# Patient Record
Sex: Female | Born: 1955 | Race: Black or African American | Hispanic: No | Marital: Married | Smoking: Never smoker
Health system: Southern US, Community
[De-identification: ages and names within clinical notes are randomized; demographics above are authoritative.]

## PROBLEM LIST (undated history)

## (undated) DIAGNOSIS — I10 Essential (primary) hypertension: Secondary | ICD-10-CM

## (undated) HISTORY — PX: TONSILLECTOMY: SUR1361

---

## 1998-03-09 ENCOUNTER — Other Ambulatory Visit: Admission: RE | Admit: 1998-03-09 | Discharge: 1998-03-09 | Payer: Self-pay | Admitting: *Deleted

## 1999-03-28 ENCOUNTER — Other Ambulatory Visit: Admission: RE | Admit: 1999-03-28 | Discharge: 1999-03-28 | Payer: Self-pay | Admitting: *Deleted

## 1999-05-12 ENCOUNTER — Encounter: Admission: RE | Admit: 1999-05-12 | Discharge: 1999-05-12 | Payer: Self-pay | Admitting: *Deleted

## 1999-05-12 ENCOUNTER — Encounter: Payer: Self-pay | Admitting: *Deleted

## 2000-08-06 ENCOUNTER — Other Ambulatory Visit: Admission: RE | Admit: 2000-08-06 | Discharge: 2000-08-06 | Payer: Self-pay | Admitting: *Deleted

## 2001-04-01 ENCOUNTER — Encounter: Admission: RE | Admit: 2001-04-01 | Discharge: 2001-04-01 | Payer: Self-pay | Admitting: *Deleted

## 2001-04-01 ENCOUNTER — Encounter: Payer: Self-pay | Admitting: *Deleted

## 2002-10-26 ENCOUNTER — Emergency Department (HOSPITAL_COMMUNITY): Admission: EM | Admit: 2002-10-26 | Discharge: 2002-10-26 | Payer: Self-pay | Admitting: Emergency Medicine

## 2003-03-19 ENCOUNTER — Encounter: Admission: RE | Admit: 2003-03-19 | Discharge: 2003-03-19 | Payer: Self-pay | Admitting: Obstetrics and Gynecology

## 2004-10-05 ENCOUNTER — Encounter: Admission: RE | Admit: 2004-10-05 | Discharge: 2004-10-05 | Payer: Self-pay | Admitting: Obstetrics and Gynecology

## 2005-06-08 ENCOUNTER — Other Ambulatory Visit: Admission: RE | Admit: 2005-06-08 | Discharge: 2005-06-08 | Payer: Self-pay | Admitting: Obstetrics and Gynecology

## 2005-09-10 ENCOUNTER — Emergency Department (HOSPITAL_COMMUNITY): Admission: EM | Admit: 2005-09-10 | Discharge: 2005-09-10 | Payer: Self-pay | Admitting: Emergency Medicine

## 2006-01-10 ENCOUNTER — Encounter: Admission: RE | Admit: 2006-01-10 | Discharge: 2006-01-10 | Payer: Self-pay | Admitting: Obstetrics and Gynecology

## 2007-02-18 ENCOUNTER — Encounter: Admission: RE | Admit: 2007-02-18 | Discharge: 2007-02-18 | Payer: Self-pay | Admitting: Obstetrics and Gynecology

## 2007-03-11 ENCOUNTER — Ambulatory Visit (HOSPITAL_COMMUNITY): Admission: RE | Admit: 2007-03-11 | Discharge: 2007-03-11 | Payer: Self-pay | Admitting: *Deleted

## 2008-02-20 ENCOUNTER — Other Ambulatory Visit: Admission: RE | Admit: 2008-02-20 | Discharge: 2008-02-20 | Payer: Self-pay | Admitting: Internal Medicine

## 2008-02-24 ENCOUNTER — Encounter: Admission: RE | Admit: 2008-02-24 | Discharge: 2008-02-24 | Payer: Self-pay | Admitting: Internal Medicine

## 2009-02-23 ENCOUNTER — Other Ambulatory Visit: Admission: RE | Admit: 2009-02-23 | Discharge: 2009-02-23 | Payer: Self-pay | Admitting: Internal Medicine

## 2009-02-24 ENCOUNTER — Encounter: Admission: RE | Admit: 2009-02-24 | Discharge: 2009-02-24 | Payer: Self-pay | Admitting: Internal Medicine

## 2010-02-25 ENCOUNTER — Encounter
Admission: RE | Admit: 2010-02-25 | Discharge: 2010-02-25 | Payer: Self-pay | Source: Home / Self Care | Attending: Internal Medicine | Admitting: Internal Medicine

## 2010-02-28 ENCOUNTER — Encounter
Admission: RE | Admit: 2010-02-28 | Discharge: 2010-02-28 | Payer: Self-pay | Source: Home / Self Care | Attending: Internal Medicine | Admitting: Internal Medicine

## 2010-07-26 NOTE — Op Note (Signed)
NAMETESSIE, ORDAZ NO.:  [id number]   MEDICAL RECORD NO.:  [id number]          PATIENT TYPE:  AMB   LOCATION:  ENDO                         FACILITY:  [facility]   PHYSICIAN:  Georgiana Spinner, M.D.    DATE OF BIRTH:  1955-10-05   DATE OF PROCEDURE:  DATE OF DISCHARGE:                               OPERATIVE REPORT   PROCEDURE:  Colonoscopy.   INDICATIONS:  Colon cancer screening.   ANESTHESIA:  Fentanyl 100 mcg, Versed 8 mg.   PROCEDURE:  With the patient mildly sedated in the left lateral  decubitus position, the Pentax videoscopic colonoscope was inserted in  the rectum, passed under direct vision with pressure applied to reach  the cecum.  The cecum was identified by ileocecal valve and appendiceal  orifice both of which were photographed.  From this point, the  colonoscope was slowly withdrawn taking circumferential views of colonic  mucosa stopping only in the rectum which appeared normal on direct and  retroflexed view.  The endoscope was straightened and withdrawn.  The  patient's vital signs, pulse oximeter remained stable.  The patient  tolerated the procedure well without apparent complications.   FINDINGS:  Negative examination.   PLAN:  Consider repeat examination in 5-10 years.           ______________________________  Georgiana Spinner, M.D.     GMO/MEDQ  D:  03/11/2007  T:  03/11/2007  Job:  [id number]

## 2011-01-27 ENCOUNTER — Other Ambulatory Visit: Payer: Self-pay | Admitting: Internal Medicine

## 2011-01-27 DIAGNOSIS — Z1231 Encounter for screening mammogram for malignant neoplasm of breast: Secondary | ICD-10-CM

## 2011-02-27 ENCOUNTER — Ambulatory Visit
Admission: RE | Admit: 2011-02-27 | Discharge: 2011-02-27 | Disposition: A | Discharge status: Home / Self Care | Payer: Commercial Indemnity | Source: Ambulatory Visit | Attending: Internal Medicine | Admitting: Internal Medicine

## 2011-02-27 DIAGNOSIS — Z1231 Encounter for screening mammogram for malignant neoplasm of breast: Secondary | ICD-10-CM

## 2011-04-05 ENCOUNTER — Other Ambulatory Visit (HOSPITAL_COMMUNITY)
Admission: RE | Admit: 2011-04-05 | Discharge: 2011-04-05 | Disposition: A | Discharge status: Home / Self Care | Payer: Commercial Indemnity | Source: Ambulatory Visit | Attending: Internal Medicine | Admitting: Internal Medicine

## 2011-04-05 DIAGNOSIS — Z01419 Encounter for gynecological examination (general) (routine) without abnormal findings: Secondary | ICD-10-CM | POA: Insufficient documentation

## 2011-06-26 ENCOUNTER — Encounter: Payer: Self-pay | Admitting: *Deleted

## 2012-02-05 ENCOUNTER — Other Ambulatory Visit: Payer: Self-pay | Admitting: Internal Medicine

## 2012-02-05 DIAGNOSIS — Z1231 Encounter for screening mammogram for malignant neoplasm of breast: Secondary | ICD-10-CM

## 2012-03-11 ENCOUNTER — Ambulatory Visit
Admission: RE | Admit: 2012-03-11 | Discharge: 2012-03-11 | Disposition: A | Discharge status: Home / Self Care | Payer: Managed Care, Other (non HMO) | Source: Ambulatory Visit | Attending: Internal Medicine | Admitting: Internal Medicine

## 2012-03-11 DIAGNOSIS — Z1231 Encounter for screening mammogram for malignant neoplasm of breast: Secondary | ICD-10-CM

## 2013-02-20 ENCOUNTER — Other Ambulatory Visit: Payer: Self-pay

## 2013-02-20 DIAGNOSIS — Z1231 Encounter for screening mammogram for malignant neoplasm of breast: Secondary | ICD-10-CM

## 2013-03-31 ENCOUNTER — Ambulatory Visit
Admission: RE | Admit: 2013-03-31 | Discharge: 2013-03-31 | Disposition: A | Discharge status: Home / Self Care | Payer: 59 | Source: Ambulatory Visit

## 2013-03-31 DIAGNOSIS — Z1231 Encounter for screening mammogram for malignant neoplasm of breast: Secondary | ICD-10-CM

## 2014-03-09 ENCOUNTER — Other Ambulatory Visit: Payer: Self-pay

## 2014-03-09 DIAGNOSIS — Z1231 Encounter for screening mammogram for malignant neoplasm of breast: Secondary | ICD-10-CM

## 2014-04-06 ENCOUNTER — Ambulatory Visit
Admission: RE | Admit: 2014-04-06 | Discharge: 2014-04-06 | Disposition: A | Discharge status: Home / Self Care | Payer: 59 | Source: Ambulatory Visit

## 2014-04-06 ENCOUNTER — Encounter (INDEPENDENT_AMBULATORY_CARE_PROVIDER_SITE_OTHER): Payer: Self-pay

## 2014-04-06 DIAGNOSIS — Z1231 Encounter for screening mammogram for malignant neoplasm of breast: Secondary | ICD-10-CM

## 2015-04-12 ENCOUNTER — Other Ambulatory Visit: Payer: Self-pay

## 2015-04-12 DIAGNOSIS — Z1231 Encounter for screening mammogram for malignant neoplasm of breast: Secondary | ICD-10-CM

## 2015-04-23 ENCOUNTER — Ambulatory Visit
Admission: RE | Admit: 2015-04-23 | Discharge: 2015-04-23 | Disposition: A | Discharge status: Home / Self Care | Payer: 59 | Source: Ambulatory Visit

## 2015-04-23 DIAGNOSIS — Z1231 Encounter for screening mammogram for malignant neoplasm of breast: Secondary | ICD-10-CM

## 2015-04-29 ENCOUNTER — Encounter (HOSPITAL_COMMUNITY): Payer: Self-pay | Admitting: Emergency Medicine

## 2015-04-29 ENCOUNTER — Emergency Department (HOSPITAL_COMMUNITY)
Admission: EM | Admit: 2015-04-29 | Discharge: 2015-04-29 | Disposition: A | Discharge status: Home / Self Care | Payer: 59 | Attending: Emergency Medicine | Admitting: Emergency Medicine

## 2015-04-29 DIAGNOSIS — R55 Syncope and collapse: Secondary | ICD-10-CM

## 2015-04-29 DIAGNOSIS — R42 Dizziness and giddiness: Secondary | ICD-10-CM | POA: Diagnosis present

## 2015-04-29 DIAGNOSIS — I10 Essential (primary) hypertension: Secondary | ICD-10-CM | POA: Diagnosis not present

## 2015-04-29 HISTORY — DX: Essential (primary) hypertension: I10

## 2015-04-29 LAB — BASIC METABOLIC PANEL
ANION GAP: 10 (ref 5–15)
BUN: 13 mg/dL (ref 6–20)
CALCIUM: 9 mg/dL (ref 8.9–10.3)
CO2: 23 mmol/L (ref 22–32)
Chloride: 106 mmol/L (ref 101–111)
Creatinine, Ser: 0.81 mg/dL (ref 0.44–1.00)
Glucose, Bld: 144 mg/dL — ABNORMAL HIGH (ref 65–99)
Potassium: 3.5 mmol/L (ref 3.5–5.1)
SODIUM: 139 mmol/L (ref 135–145)

## 2015-04-29 LAB — CBG MONITORING, ED: GLUCOSE-CAPILLARY: 129 mg/dL — AB (ref 65–99)

## 2015-04-29 LAB — URINE MICROSCOPIC-ADD ON: WBC, UA: NONE SEEN WBC/hpf (ref 0–5)

## 2015-04-29 LAB — TROPONIN I: Troponin I: <0.03 ng/mL

## 2015-04-29 LAB — URINALYSIS, ROUTINE W REFLEX MICROSCOPIC
BILIRUBIN URINE: NEGATIVE
Glucose, UA: NEGATIVE mg/dL
Ketones, ur: 40 mg/dL — AB
Leukocytes, UA: NEGATIVE
NITRITE: NEGATIVE
PROTEIN: 30 mg/dL — AB
SPECIFIC GRAVITY, URINE: 1.027 (ref 1.005–1.030)
pH: 5.5 (ref 5.0–8.0)

## 2015-04-29 LAB — CBC
HCT: 37.1 % (ref 36.0–46.0)
HEMOGLOBIN: 11.4 g/dL — AB (ref 12.0–15.0)
MCH: 24.2 pg — ABNORMAL LOW (ref 26.0–34.0)
MCHC: 30.7 g/dL (ref 30.0–36.0)
MCV: 78.6 fL (ref 78.0–100.0)
PLATELETS: 152 10*3/uL (ref 150–400)
RBC: 4.72 MIL/uL (ref 3.87–5.11)
RDW: 13.5 % (ref 11.5–15.5)
WBC: 5.7 10*3/uL (ref 4.0–10.5)

## 2015-04-29 MED ORDER — SODIUM CHLORIDE 0.9 % IV BOLUS (SEPSIS)
1000.0000 mL | Freq: Once | INTRAVENOUS | Status: AC
  Administered 2015-04-29: 1000 mL via INTRAVENOUS

## 2015-04-29 MED ORDER — IBUPROFEN 200 MG PO TABS
600.0000 mg | ORAL_TABLET | Freq: Once | ORAL | Status: AC
  Administered 2015-04-29: 600 mg via ORAL
  Filled 2015-04-29: qty 1

## 2015-04-29 NOTE — Discharge Instructions (Signed)
Syncope Syncope means a person passes out (faints). The person usually wakes up in less than 5 minutes. It is important to seek medical care for syncope. HOME CARE  Have someone stay with you until you feel normal.  Do not drive, use machines, or play sports until your doctor says it is okay.  Keep all doctor visits as told.  Lie down when you feel like you might pass out. Take deep breaths. Wait until you feel normal before standing up.  Drink enough fluids to keep your pee (urine) clear or pale yellow.  If you take blood pressure or heart medicine, get up slowly. Take several minutes to sit and then stand. GET HELP RIGHT AWAY IF:   You have a severe headache.  You have pain in the chest, belly (abdomen), or back.  You are bleeding from the mouth or butt (rectum).  You have black or tarry poop (stool).  You have an irregular or very fast heartbeat.  You have pain with breathing.  You keep passing out, or you have shaking (seizures) when you pass out.  You pass out when sitting or lying down.  You feel confused.  You have trouble walking.  You have severe weakness.  You have vision problems. If you fainted, call for help (911 in [place] Do not drive yourself to the hospital.   This information is not intended to replace advice given to you by your health care provider. Make sure you discuss any questions you have with your health care provider.   Document Released: 08/16/2007 Document Revised: 07/14/2014 Document Reviewed: 04/28/2011 Elsevier Interactive Patient Education 2016 [employer]  Dizziness Dizziness is a common problem. It is a feeling of unsteadiness or light-headedness. You may feel like you are about to faint. Dizziness can lead to injury if you stumble or fall. Anyone can become dizzy, but dizziness is more common in older adults. This condition can be caused by a number of things, including medicines, dehydration, or illness. HOME CARE  INSTRUCTIONS Taking these steps may help with your condition: Eating and Drinking  Drink enough fluid to keep your urine clear or pale yellow. This helps to keep you from becoming dehydrated. Try to drink more clear fluids, such as water.  Do not drink alcohol.  Limit your caffeine intake if directed by your health care provider.  Limit your salt intake if directed by your health care provider. Activity  Avoid making quick movements.  Rise slowly from chairs and steady yourself until you feel okay.  In the morning, first sit up on the side of the bed. When you feel okay, stand slowly while you hold onto something until you know that your balance is fine.  Move your legs often if you need to stand in one place for a long time. Tighten and relax your muscles in your legs while you are standing.  Do not drive or operate heavy machinery if you feel dizzy.  Avoid bending down if you feel dizzy. Place items in your home so that they are easy for you to reach without leaning over. Lifestyle  Do not use any tobacco products, including cigarettes, chewing tobacco, or electronic cigarettes. If you need help quitting, ask your health care provider.  Try to reduce your stress level, such as with yoga or meditation. Talk with your health care provider if you need help. General Instructions  Watch your dizziness for any changes.  Take medicines only as directed by your health care provider. Talk with  your health care provider if you think that your dizziness is caused by a medicine that you are taking.  Tell a friend or a family member that you are feeling dizzy. If he or she notices any changes in your behavior, have this person call your health care provider.  Keep all follow-up visits as directed by your health care provider. This is important. SEEK MEDICAL CARE IF:  Your dizziness does not go away.  Your dizziness or light-headedness gets worse.  You feel nauseous.  You have  reduced hearing.  You have new symptoms.  You are unsteady on your feet or you feel like the room is spinning. SEEK IMMEDIATE MEDICAL CARE IF:  You vomit or have diarrhea and are unable to eat or drink anything.  You have problems talking, walking, swallowing, or using your arms, hands, or legs.  You feel generally weak.  You are not thinking clearly or you have trouble forming sentences. It may take a friend or family member to notice this.  You have chest pain, abdominal pain, shortness of breath, or sweating.  Your vision changes.  You notice any bleeding.  You have a headache.  You have neck pain or a stiff neck.  You have a fever.   This information is not intended to replace advice given to you by your health care provider. Make sure you discuss any questions you have with your health care provider.   Follow up with your primary care provider for reevaluation. Encourage adequate hydration, drink plenty of fluids. May take ibuprofen for your body aches. Return to the emergency department if you experience severe worsening of her symptoms, additional loss of consciousness, severe headache, blurry vision, fever, chills, vomiting, chest pain or shortness of breath.

## 2015-04-29 NOTE — ED Notes (Signed)
Pt states that she did not feel dizzy at any point during orthostatic Vital Sign

## 2015-04-29 NOTE — ED Provider Notes (Signed)
CSN: [id number]     Arrival date & time 04/29/15  0510 History   First MD Initiated Contact with Patient 04/29/15 [phone]     Chief Complaint  Patient presents with  . Dizziness     (Consider location/radiation/quality/duration/timing/severity/associated sxs/prior Treatment) HPI   Betty Walker is a 60 y.o F with a pmhx of HTN who presents to the ED today c/o lightheadedness and syncope. Pt states that yesterday she felt gradual onset lightheadedness upon standing. Pt had associated "spots in her vision" and myalgias. Pt states that she woke up around 4 am this morning for work and again felt lightheaded that progressively worsened. Pt decided to go lay back down, but per her husband she passed out on her way back to the bedroom. Per husband pts eyes rolled back in her head and she fainted. Husband caught her and layed her on the bed. He states that the pt was out for only a few seconds. Pt does not recall this event. Since this even pt states she has not had the feeling of lightheadedness but is still experiencing myalgias. Pt states that she received a TdaP vaccine on 2/14. Denies N/V, recent illness, CP, SOB, paresthesias, weakness. Pt has had decreased appetite over the last 2 days. No hx of AS or CHF.   Past Medical History  Diagnosis Date  . Hypertension    Past Surgical History  Procedure Laterality Date  . Tonsillectomy     No family history on file. Social History  Substance Use Topics  . Smoking status: Never Smoker   . Smokeless tobacco: None  . Alcohol Use: No   OB History    No data available     Review of Systems  All other systems reviewed and are negative.     Allergies  Review of patient's allergies indicates no known allergies.  Home Medications   Prior to Admission medications   Not on File   BP 123/74 mmHg  Pulse 73  Temp(Src) 97.9 F (36.6 C) (Oral)  Resp 17  Ht  (1.575 m)  Wt 66.679 kg  BMI 26.88 kg/m2  SpO2 100% Physical Exam   Constitutional: She is oriented to person, place, and time. She appears well-developed and well-nourished. No distress.  HENT:  Head: Normocephalic and atraumatic.  Mouth/Throat: No oropharyngeal exudate.  Eyes: Conjunctivae and EOM are normal. Pupils are equal, round, and reactive to light. Right eye exhibits no discharge. Left eye exhibits no discharge. No scleral icterus.  Neck: Normal range of motion. Neck supple. No JVD present.  Cardiovascular: Normal rate, regular rhythm, normal heart sounds and intact distal pulses.  Exam reveals no gallop and no friction rub.   No murmur heard. Pulmonary/Chest: Effort normal and breath sounds normal. No respiratory distress. She has no wheezes. She has no rales. She exhibits no tenderness.  Abdominal: Soft. Bowel sounds are normal. She exhibits no distension. There is no tenderness. There is no guarding.  Musculoskeletal: Normal range of motion. She exhibits no edema.  Lymphadenopathy:    She has no cervical adenopathy.  Neurological: She is alert and oriented to person, place, and time.  Strength 5/5 throughout. No sensory deficits.  No gait abnormality.  Skin: Skin is warm and dry. No rash noted. She is not diaphoretic. No erythema. No pallor.  Psychiatric: She has a normal mood and affect. Her behavior is normal.  Nursing note and vitals reviewed.   ED Course  Procedures (including critical care time) Labs Review Labs Reviewed  BASIC METABOLIC PANEL - Abnormal; Notable for the following:    Glucose, Bld 144 (*)    All other components within normal limits  CBC - Abnormal; Notable for the following:    Hemoglobin 11.4 (*)    MCH 24.2 (*)    All other components within normal limits  URINALYSIS, ROUTINE W REFLEX MICROSCOPIC (NOT AT [facility]) - Abnormal; Notable for the following:    Color, Urine AMBER (*)    Hgb urine dipstick SMALL (*)    Ketones, ur 40 (*)    Protein, ur 30 (*)    All other components within normal limits  URINE  MICROSCOPIC-ADD ON - Abnormal; Notable for the following:    Squamous Epithelial / LPF 0-5 (*)    Bacteria, UA RARE (*)    All other components within normal limits  CBG MONITORING, ED - Abnormal; Notable for the following:    Glucose-Capillary 129 (*)    All other components within normal limits  TROPONIN I    Imaging Review No results found. I have personally reviewed and evaluated these images and lab results as part of my medical decision-making.   EKG Interpretation   Date/Time:  Thursday April 29 2015 05:29:21 EST Ventricular Rate:  76 PR Interval:  132 QRS Duration: 90 QT Interval:  386 QTC Calculation: 434 R Axis:   55 Text Interpretation:  Normal sinus rhythm Cannot rule out Anterior infarct  , age undetermined Abnormal ECG Nonspecific T wave abnormality No old  tracing to compare Confirmed by [facility]  MD, DAVID ([id number]) on 04/29/2015  6:30:42 AM      MDM   Final diagnoses:  Lightheadedness  Syncope, unspecified syncope type    Otherwise healthy 60 y.o F presents to the ED c/o lightheadedness onset yesterday and episode of syncope that occurred around 4am. Pt felt lightheaded upon standing and subsequently passed out for approximately 3 seconds per husband who witnessed event. Pt is asymptomatic in ED, appears well. Only c/o mild myalgias which have been present since TdaP vaccine received on 04/26/14. EKG unremarkable. Troponin wnl. All other lab work unremarkable. No murmur auscultated, doubt AS. Suspect orthostatic hypotension vs vasovagal as etiology of syncope. Pt is low risk on [place] syncope rule. No hx of CHF. Pt given 1L fluids. Orthostatic VS wnl in ED. Ibuprofen given for myalgias.   Patient reports significant symptomatic improvement after fluids and ibuprofen. Feel the patient is safe for discharge with PCP follow-up. Doubt cardiogenic etiology of syncope. Return precautions outlined in patient discharge instructions.   Patient was discussed with  Dr. Preston Fleeting who agrees with the treatment plan.      Lester Kinsman Lennox, PA-C 04/29/15 [id number]  Dione Booze, MD 04/30/15 [phone]

## 2015-04-29 NOTE — ED Notes (Signed)
Patient here with complaint of dizziness. States onset yesterday 04/28/2015 @ 1100. Reports that she felt weak and dizzy all day yesterday which prompted her to go to bed early. Husband reports that patient woke early this morning around 0230, and subsequently passed out around 0345. Denies paresthesia. No neuro deficits identified in triage. Additionally reports that she received a TDAP vaccine on Tues 04/27/2015. Also endorses body aches and soreness in the arm which received the injection.

## 2016-03-22 ENCOUNTER — Other Ambulatory Visit: Payer: Self-pay | Admitting: Internal Medicine

## 2016-03-22 DIAGNOSIS — Z1231 Encounter for screening mammogram for malignant neoplasm of breast: Secondary | ICD-10-CM

## 2016-06-09 ENCOUNTER — Ambulatory Visit
Admission: RE | Admit: 2016-06-09 | Discharge: 2016-06-09 | Disposition: A | Discharge status: Home / Self Care | Payer: 59 | Source: Ambulatory Visit | Attending: Internal Medicine | Admitting: Internal Medicine

## 2016-06-09 DIAGNOSIS — Z1231 Encounter for screening mammogram for malignant neoplasm of breast: Secondary | ICD-10-CM

## 2017-07-24 ENCOUNTER — Other Ambulatory Visit: Payer: Self-pay | Admitting: Internal Medicine

## 2017-07-24 DIAGNOSIS — Z1231 Encounter for screening mammogram for malignant neoplasm of breast: Secondary | ICD-10-CM

## 2017-08-15 ENCOUNTER — Ambulatory Visit
Admission: RE | Admit: 2017-08-15 | Discharge: 2017-08-15 | Disposition: A | Discharge status: Home / Self Care | Payer: PRIVATE HEALTH INSURANCE | Source: Ambulatory Visit | Attending: Internal Medicine | Admitting: Internal Medicine

## 2017-08-15 DIAGNOSIS — Z1231 Encounter for screening mammogram for malignant neoplasm of breast: Secondary | ICD-10-CM

## 2018-11-27 ENCOUNTER — Other Ambulatory Visit: Payer: Self-pay | Admitting: Internal Medicine

## 2018-11-27 DIAGNOSIS — Z1231 Encounter for screening mammogram for malignant neoplasm of breast: Secondary | ICD-10-CM

## 2019-01-13 ENCOUNTER — Ambulatory Visit
Admission: RE | Admit: 2019-01-13 | Discharge: 2019-01-13 | Disposition: A | Discharge status: Home / Self Care | Payer: No Typology Code available for payment source | Source: Ambulatory Visit | Attending: Internal Medicine | Admitting: Internal Medicine

## 2019-01-13 ENCOUNTER — Other Ambulatory Visit: Payer: Self-pay

## 2019-01-13 DIAGNOSIS — Z1231 Encounter for screening mammogram for malignant neoplasm of breast: Secondary | ICD-10-CM

## 2019-12-08 ENCOUNTER — Other Ambulatory Visit: Payer: Self-pay | Admitting: Obstetrics and Gynecology

## 2019-12-08 DIAGNOSIS — Z1231 Encounter for screening mammogram for malignant neoplasm of breast: Secondary | ICD-10-CM

## 2020-01-15 ENCOUNTER — Ambulatory Visit: Payer: No Typology Code available for payment source

## 2020-01-20 ENCOUNTER — Other Ambulatory Visit: Payer: Self-pay

## 2020-01-20 ENCOUNTER — Ambulatory Visit
Admission: RE | Admit: 2020-01-20 | Discharge: 2020-01-20 | Disposition: A | Discharge status: Home / Self Care | Payer: No Typology Code available for payment source | Source: Ambulatory Visit | Attending: Obstetrics and Gynecology | Admitting: Obstetrics and Gynecology

## 2020-01-20 DIAGNOSIS — Z1231 Encounter for screening mammogram for malignant neoplasm of breast: Secondary | ICD-10-CM

## 2020-12-14 ENCOUNTER — Other Ambulatory Visit: Payer: Self-pay | Admitting: Obstetrics and Gynecology

## 2020-12-14 DIAGNOSIS — Z1231 Encounter for screening mammogram for malignant neoplasm of breast: Secondary | ICD-10-CM

## 2021-01-21 ENCOUNTER — Ambulatory Visit
Admission: RE | Admit: 2021-01-21 | Discharge: 2021-01-21 | Disposition: A | Discharge status: Home / Self Care | Payer: Medicare HMO | Source: Ambulatory Visit | Attending: Obstetrics and Gynecology | Admitting: Obstetrics and Gynecology

## 2021-01-21 DIAGNOSIS — Z1231 Encounter for screening mammogram for malignant neoplasm of breast: Secondary | ICD-10-CM

## 2021-03-09 ENCOUNTER — Ambulatory Visit: Payer: Medicare HMO | Admitting: Radiation Oncology

## 2021-04-21 DIAGNOSIS — Z6825 Body mass index (BMI) 25.0-25.9, adult: Secondary | ICD-10-CM | POA: Diagnosis not present

## 2021-04-21 DIAGNOSIS — Z01419 Encounter for gynecological examination (general) (routine) without abnormal findings: Secondary | ICD-10-CM | POA: Diagnosis not present

## 2021-06-08 DIAGNOSIS — Z0000 Encounter for general adult medical examination without abnormal findings: Secondary | ICD-10-CM | POA: Diagnosis not present

## 2021-06-08 DIAGNOSIS — R7309 Other abnormal glucose: Secondary | ICD-10-CM | POA: Diagnosis not present

## 2021-06-08 DIAGNOSIS — I10 Essential (primary) hypertension: Secondary | ICD-10-CM | POA: Diagnosis not present

## 2021-06-08 DIAGNOSIS — R7989 Other specified abnormal findings of blood chemistry: Secondary | ICD-10-CM | POA: Diagnosis not present

## 2021-06-08 DIAGNOSIS — R3129 Other microscopic hematuria: Secondary | ICD-10-CM | POA: Diagnosis not present

## 2021-06-15 DIAGNOSIS — Z0000 Encounter for general adult medical examination without abnormal findings: Secondary | ICD-10-CM | POA: Diagnosis not present

## 2021-06-15 DIAGNOSIS — R7309 Other abnormal glucose: Secondary | ICD-10-CM | POA: Diagnosis not present

## 2021-06-15 DIAGNOSIS — Z1211 Encounter for screening for malignant neoplasm of colon: Secondary | ICD-10-CM | POA: Diagnosis not present

## 2021-06-15 DIAGNOSIS — M810 Age-related osteoporosis without current pathological fracture: Secondary | ICD-10-CM | POA: Diagnosis not present

## 2021-06-15 DIAGNOSIS — I10 Essential (primary) hypertension: Secondary | ICD-10-CM | POA: Diagnosis not present

## 2021-07-04 DIAGNOSIS — M810 Age-related osteoporosis without current pathological fracture: Secondary | ICD-10-CM | POA: Diagnosis not present

## 2021-08-01 DIAGNOSIS — M810 Age-related osteoporosis without current pathological fracture: Secondary | ICD-10-CM | POA: Diagnosis not present

## 2021-08-01 DIAGNOSIS — I10 Essential (primary) hypertension: Secondary | ICD-10-CM | POA: Diagnosis not present

## 2021-08-01 DIAGNOSIS — Z1211 Encounter for screening for malignant neoplasm of colon: Secondary | ICD-10-CM | POA: Diagnosis not present

## 2021-09-08 DIAGNOSIS — Z1211 Encounter for screening for malignant neoplasm of colon: Secondary | ICD-10-CM | POA: Diagnosis not present

## 2021-10-24 DIAGNOSIS — H6120 Impacted cerumen, unspecified ear: Secondary | ICD-10-CM | POA: Diagnosis not present

## 2021-12-23 ENCOUNTER — Other Ambulatory Visit: Payer: Self-pay | Admitting: Obstetrics and Gynecology

## 2021-12-23 DIAGNOSIS — Z1231 Encounter for screening mammogram for malignant neoplasm of breast: Secondary | ICD-10-CM

## 2022-01-12 DIAGNOSIS — H524 Presbyopia: Secondary | ICD-10-CM | POA: Diagnosis not present

## 2022-01-12 DIAGNOSIS — H25813 Combined forms of age-related cataract, bilateral: Secondary | ICD-10-CM | POA: Diagnosis not present

## 2022-01-12 DIAGNOSIS — H5213 Myopia, bilateral: Secondary | ICD-10-CM | POA: Diagnosis not present

## 2022-02-08 ENCOUNTER — Ambulatory Visit
Admission: RE | Admit: 2022-02-08 | Discharge: 2022-02-08 | Disposition: A | Discharge status: Home / Self Care | Payer: Medicare HMO | Source: Ambulatory Visit | Attending: Obstetrics and Gynecology | Admitting: Obstetrics and Gynecology

## 2022-02-08 DIAGNOSIS — Z1231 Encounter for screening mammogram for malignant neoplasm of breast: Secondary | ICD-10-CM

## 2022-06-13 DIAGNOSIS — R7309 Other abnormal glucose: Secondary | ICD-10-CM | POA: Diagnosis not present

## 2022-06-13 DIAGNOSIS — I10 Essential (primary) hypertension: Secondary | ICD-10-CM | POA: Diagnosis not present

## 2022-06-13 DIAGNOSIS — E7800 Pure hypercholesterolemia, unspecified: Secondary | ICD-10-CM | POA: Diagnosis not present

## 2022-06-13 DIAGNOSIS — M810 Age-related osteoporosis without current pathological fracture: Secondary | ICD-10-CM | POA: Diagnosis not present

## 2022-06-13 DIAGNOSIS — Z13820 Encounter for screening for osteoporosis: Secondary | ICD-10-CM | POA: Diagnosis not present

## 2022-06-13 DIAGNOSIS — R7989 Other specified abnormal findings of blood chemistry: Secondary | ICD-10-CM | POA: Diagnosis not present

## 2022-06-20 DIAGNOSIS — Z0000 Encounter for general adult medical examination without abnormal findings: Secondary | ICD-10-CM | POA: Diagnosis not present

## 2022-06-20 DIAGNOSIS — I10 Essential (primary) hypertension: Secondary | ICD-10-CM | POA: Diagnosis not present

## 2022-06-20 DIAGNOSIS — H6120 Impacted cerumen, unspecified ear: Secondary | ICD-10-CM | POA: Diagnosis not present

## 2022-06-20 DIAGNOSIS — M810 Age-related osteoporosis without current pathological fracture: Secondary | ICD-10-CM | POA: Diagnosis not present

## 2022-07-04 DIAGNOSIS — Z6825 Body mass index (BMI) 25.0-25.9, adult: Secondary | ICD-10-CM | POA: Diagnosis not present

## 2022-07-04 DIAGNOSIS — Z124 Encounter for screening for malignant neoplasm of cervix: Secondary | ICD-10-CM | POA: Diagnosis not present

## 2022-07-22 DIAGNOSIS — H524 Presbyopia: Secondary | ICD-10-CM | POA: Diagnosis not present

## 2022-07-22 DIAGNOSIS — H5213 Myopia, bilateral: Secondary | ICD-10-CM | POA: Diagnosis not present

## 2023-01-22 ENCOUNTER — Other Ambulatory Visit: Payer: Self-pay | Admitting: Obstetrics and Gynecology

## 2023-01-22 DIAGNOSIS — Z1231 Encounter for screening mammogram for malignant neoplasm of breast: Secondary | ICD-10-CM

## 2023-03-12 ENCOUNTER — Ambulatory Visit
Admission: RE | Admit: 2023-03-12 | Discharge: 2023-03-12 | Disposition: A | Discharge status: Home / Self Care | Payer: Medicare HMO | Source: Ambulatory Visit | Attending: Obstetrics and Gynecology | Admitting: Obstetrics and Gynecology

## 2023-03-12 DIAGNOSIS — H2513 Age-related nuclear cataract, bilateral: Secondary | ICD-10-CM | POA: Diagnosis not present

## 2023-03-12 DIAGNOSIS — H5213 Myopia, bilateral: Secondary | ICD-10-CM | POA: Diagnosis not present

## 2023-03-12 DIAGNOSIS — H524 Presbyopia: Secondary | ICD-10-CM | POA: Diagnosis not present

## 2023-03-12 DIAGNOSIS — Z1231 Encounter for screening mammogram for malignant neoplasm of breast: Secondary | ICD-10-CM

## 2023-03-12 DIAGNOSIS — H52223 Regular astigmatism, bilateral: Secondary | ICD-10-CM | POA: Diagnosis not present

## 2023-06-19 DIAGNOSIS — R7989 Other specified abnormal findings of blood chemistry: Secondary | ICD-10-CM | POA: Diagnosis not present

## 2023-06-19 DIAGNOSIS — I10 Essential (primary) hypertension: Secondary | ICD-10-CM | POA: Diagnosis not present

## 2023-06-19 DIAGNOSIS — R7309 Other abnormal glucose: Secondary | ICD-10-CM | POA: Diagnosis not present

## 2023-06-19 DIAGNOSIS — E7800 Pure hypercholesterolemia, unspecified: Secondary | ICD-10-CM | POA: Diagnosis not present

## 2023-06-19 DIAGNOSIS — D649 Anemia, unspecified: Secondary | ICD-10-CM | POA: Diagnosis not present

## 2023-06-19 DIAGNOSIS — M810 Age-related osteoporosis without current pathological fracture: Secondary | ICD-10-CM | POA: Diagnosis not present

## 2023-06-27 DIAGNOSIS — M810 Age-related osteoporosis without current pathological fracture: Secondary | ICD-10-CM | POA: Diagnosis not present

## 2023-06-27 DIAGNOSIS — I10 Essential (primary) hypertension: Secondary | ICD-10-CM | POA: Diagnosis not present

## 2023-06-27 DIAGNOSIS — Z0000 Encounter for general adult medical examination without abnormal findings: Secondary | ICD-10-CM | POA: Diagnosis not present

## 2023-06-27 DIAGNOSIS — H6123 Impacted cerumen, bilateral: Secondary | ICD-10-CM | POA: Diagnosis not present

## 2023-09-12 DIAGNOSIS — Z01419 Encounter for gynecological examination (general) (routine) without abnormal findings: Secondary | ICD-10-CM | POA: Diagnosis not present

## 2023-09-12 DIAGNOSIS — Z6825 Body mass index (BMI) 25.0-25.9, adult: Secondary | ICD-10-CM | POA: Diagnosis not present

## 2023-09-22 IMAGING — MG MM DIGITAL SCREENING BILAT W/ TOMO AND CAD
8 series · 9 of 24 positions shown · non-contrast
Comparison: Previous exam(s).

CLINICAL DATA: Screening.

EXAM:
DIGITAL SCREENING BILATERAL MAMMOGRAM WITH TOMOSYNTHESIS AND CAD
TECHNIQUE: Bilateral screening digital craniocaudal and mediolateral oblique
mammograms were obtained. Bilateral screening digital breast
tomosynthesis was performed. The images were evaluated with
computer-aided detection.

[R MLO synth-2D]
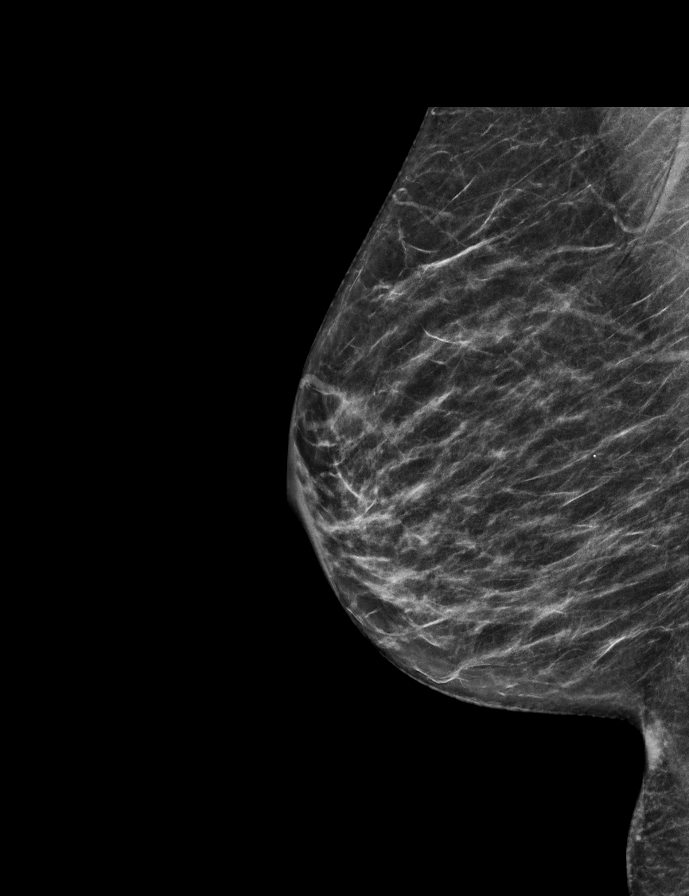

[L CC synth-2D]
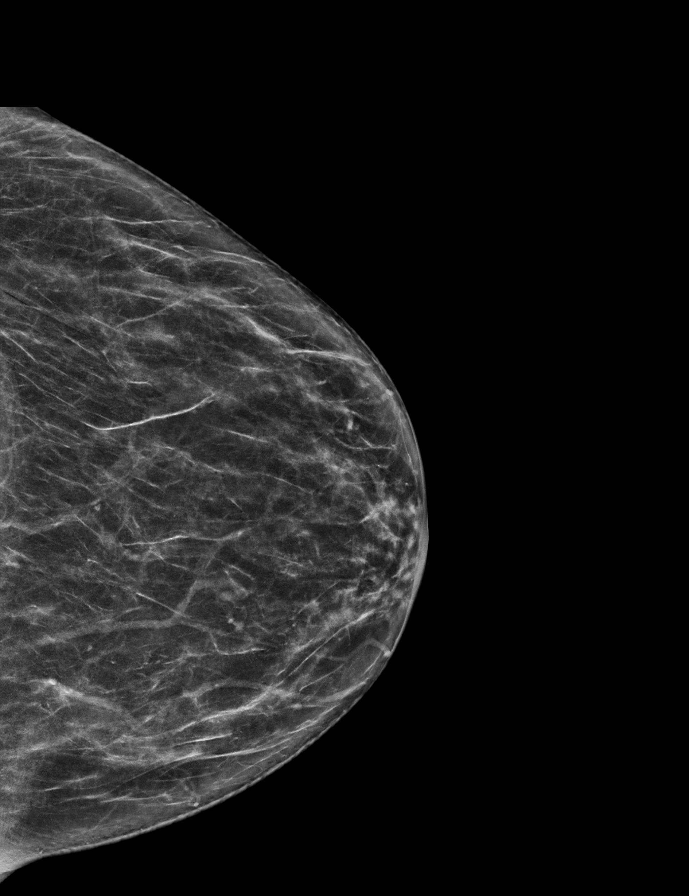

[R CC synth-2D]
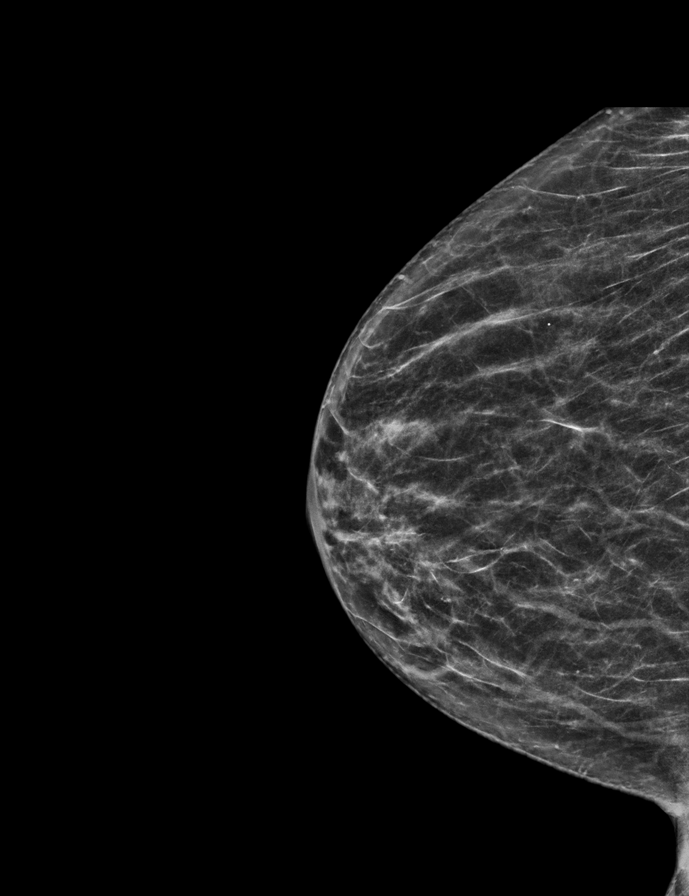

[L MLO synth-2D]
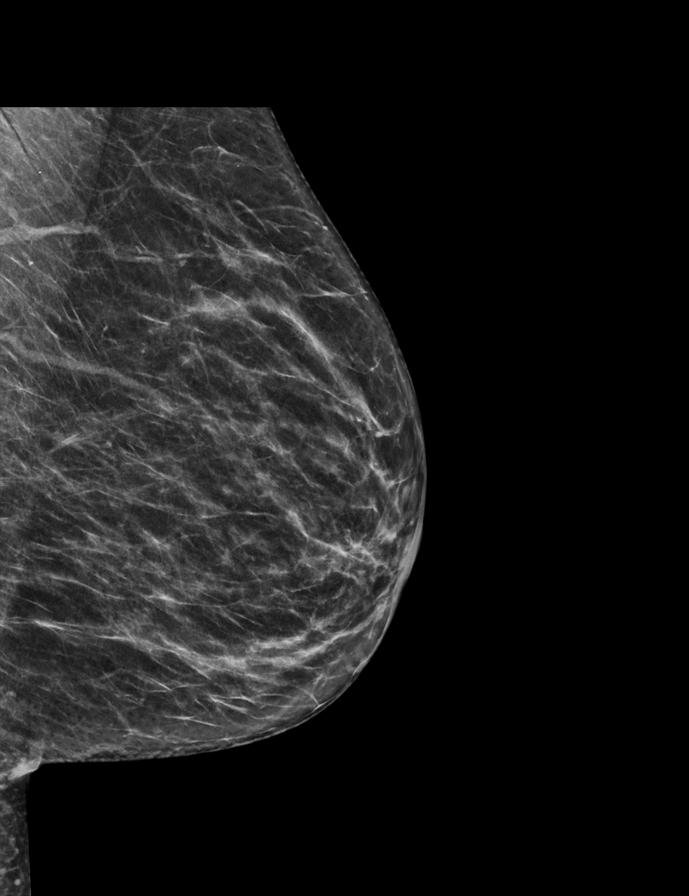

[R CC tomo · 2 of 59 frames shown]
[frame 20/59]
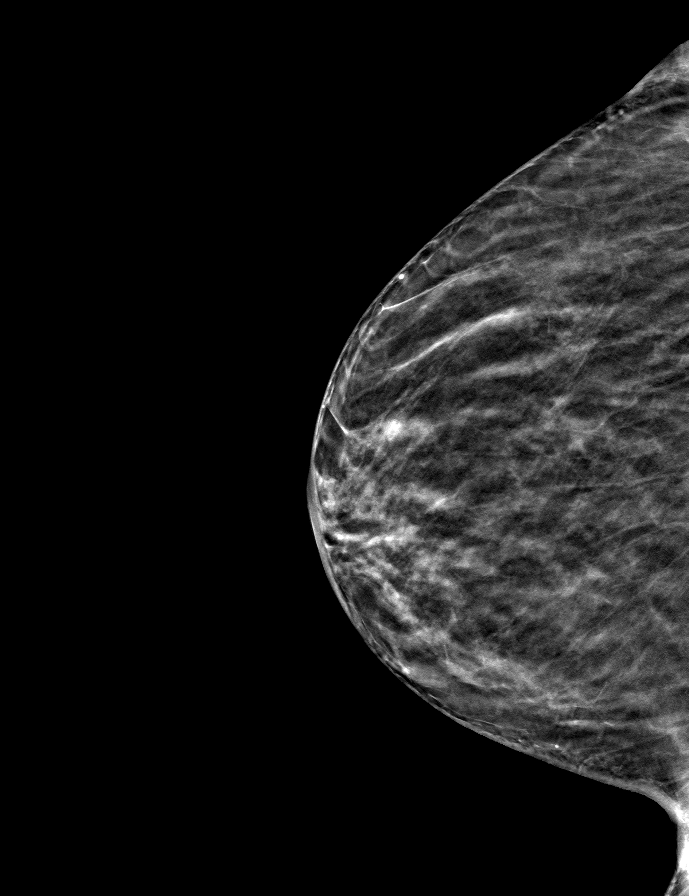
[frame 30/59]
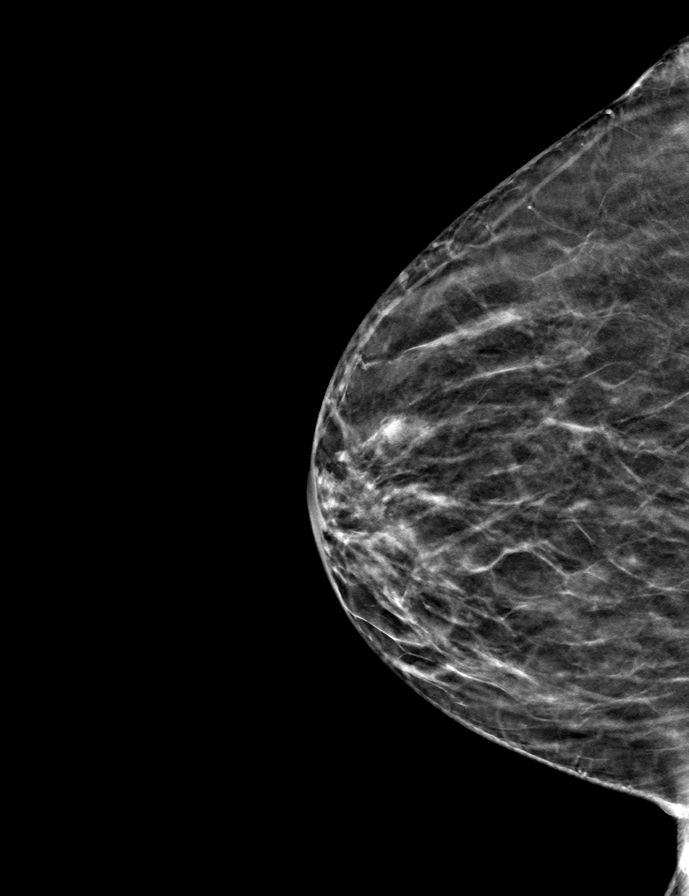

[L CC tomo · tomo slice 30/59.0]
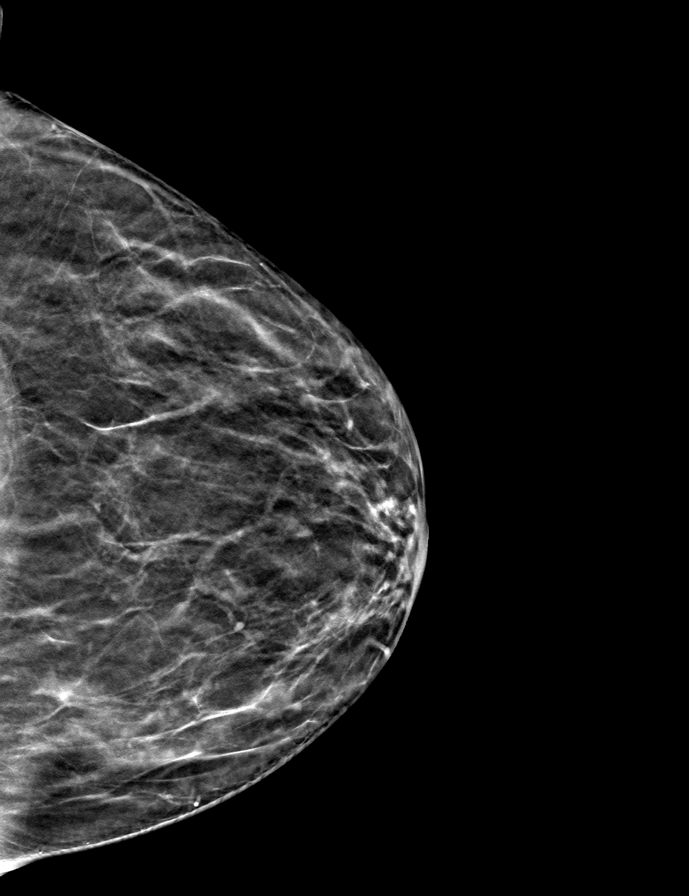

[R MLO tomo · tomo slice 30/59.0]
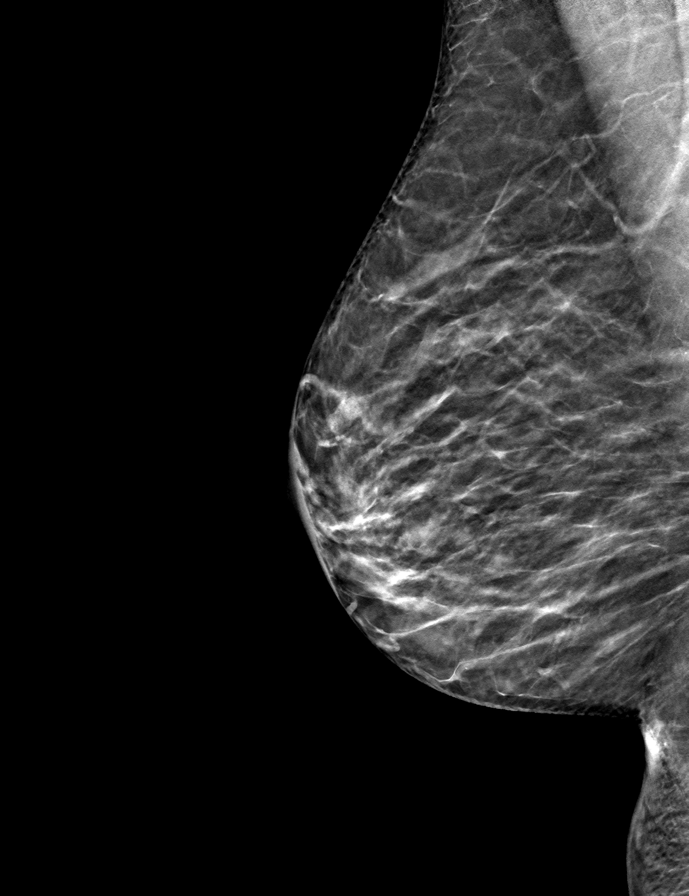

[L MLO tomo · tomo slice 31/61.0]
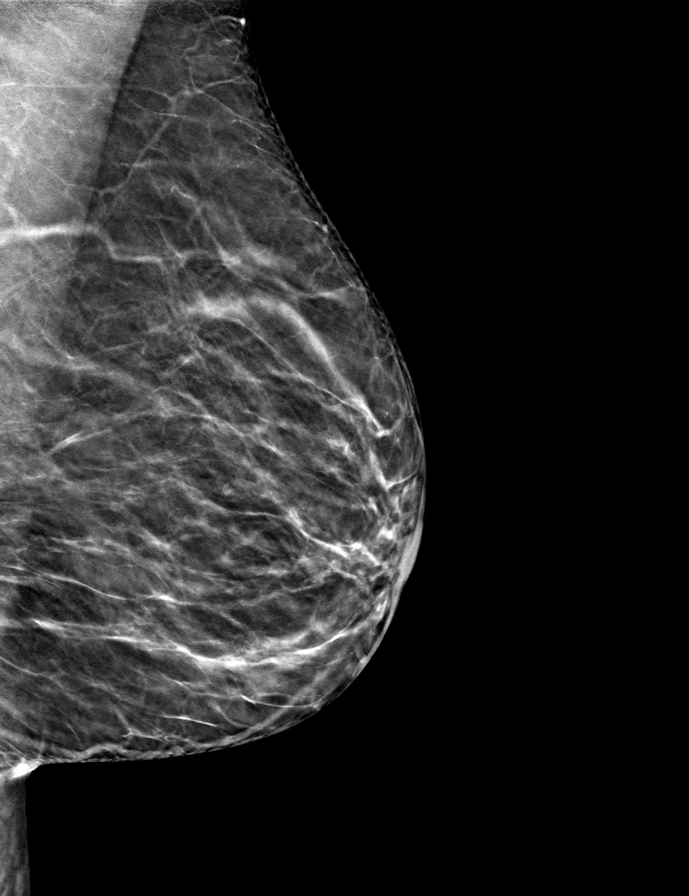

[9 of 24 positions shown; findings below may reference images not displayed]

ACR Breast Density Category b: There are scattered areas of
fibroglandular density.
FINDINGS: There are no findings suspicious for malignancy.
IMPRESSION: No mammographic evidence of malignancy. A result letter of this
screening mammogram will be mailed directly to the patient.

RECOMMENDATION:
Screening mammogram in one year. (Code:51-O-LD2)

BI-RADS CATEGORY  1: Negative.

## 2023-10-17 DIAGNOSIS — W101XXS Fall (on)(from) sidewalk curb, sequela: Secondary | ICD-10-CM | POA: Diagnosis not present

## 2023-10-17 DIAGNOSIS — R200 Anesthesia of skin: Secondary | ICD-10-CM | POA: Diagnosis not present

## 2024-02-21 ENCOUNTER — Other Ambulatory Visit: Payer: Self-pay | Admitting: Obstetrics and Gynecology

## 2024-02-21 DIAGNOSIS — Z1231 Encounter for screening mammogram for malignant neoplasm of breast: Secondary | ICD-10-CM

## 2024-03-14 ENCOUNTER — Ambulatory Visit
Admission: RE | Admit: 2024-03-14 | Discharge: 2024-03-14 | Disposition: A | Discharge status: Home / Self Care | Source: Ambulatory Visit | Attending: Obstetrics and Gynecology | Admitting: Obstetrics and Gynecology

## 2024-03-14 DIAGNOSIS — Z1231 Encounter for screening mammogram for malignant neoplasm of breast: Secondary | ICD-10-CM
